# Patient Record
Sex: Female | Born: 1995 | Race: White | Hispanic: No | Marital: Single | Smoking: Never smoker
Health system: Southern US, Community
[De-identification: ages and names within clinical notes are randomized; demographics above are authoritative.]

## PROBLEM LIST (undated history)

## (undated) DIAGNOSIS — T7840XA Allergy, unspecified, initial encounter: Secondary | ICD-10-CM

## (undated) DIAGNOSIS — U071 COVID-19: Secondary | ICD-10-CM

## (undated) DIAGNOSIS — R002 Palpitations: Secondary | ICD-10-CM

## (undated) DIAGNOSIS — G43909 Migraine, unspecified, not intractable, without status migrainosus: Secondary | ICD-10-CM

## (undated) HISTORY — DX: Palpitations: R00.2

## (undated) HISTORY — DX: COVID-19: U07.1

## (undated) HISTORY — DX: Allergy, unspecified, initial encounter: T78.40XA

## (undated) HISTORY — DX: Migraine, unspecified, not intractable, without status migrainosus: G43.909

---
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
## 2020-07-02 ENCOUNTER — Encounter: Payer: Self-pay | Admitting: *Deleted

## 2020-07-03 ENCOUNTER — Ambulatory Visit (INDEPENDENT_AMBULATORY_CARE_PROVIDER_SITE_OTHER): Payer: 59 | Admitting: Cardiology

## 2020-07-03 ENCOUNTER — Encounter: Payer: Self-pay | Admitting: Cardiology

## 2020-07-03 VITALS — BP 118/80 | HR 78 | Ht 65.0 in | Wt 129.2 lb

## 2020-07-03 DIAGNOSIS — R002 Palpitations: Secondary | ICD-10-CM

## 2020-07-03 NOTE — Progress Notes (Signed)
Clinical Summary Christine Case is a 24 y.o.female seen as a new consult for the following medical problems.    1. Palpitations - started after covid in November - feeling of heart racing at times, feels lightheaded. Pulse rate 95-120.  - brought on by caffeine now, previosuly  - can last several hours. 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 Completed undergrad at [employer] on vacation to [place]  Past Medical History:  Diagnosis Date  . Allergies   . COVID-19   . Migraines   . Palpitations      No Known Allergies   Current Outpatient Medications  Medication Sig Dispense Refill  . cetirizine (ZYRTEC) 10 MG tablet Take 10 mg by mouth daily.    . meloxicam (MOBIC) 7.5 MG tablet meloxicam 7.5 mg tablet  Take 1 tablet twice a day by oral route with meals for 10 days.    . Multiple Vitamins-Minerals (MULTIVITAMIN WOMEN PO) Take 1 tablet by mouth daily.    . SUMAtriptan (IMITREX) 100 MG tablet Take 100 mg by mouth every 2 (two) hours as needed for migraine. May repeat in 2 hours if headache persists or recurs.     No current facility-administered medications for this visit.        No Known Allergies    Family History  Problem Relation Age of Onset  . Hypertension Mother   . Colon cancer Father 4  . Hyperlipidemia Father   . Hypertension Maternal Grandfather   . Cervical cancer Paternal Grandmother   . Stomach cancer Paternal Grandfather   . Colon cancer Paternal Grandfather      Social History Ms. Oliver reports that she has never smoked. She has never used smokeless tobacco. Ms. Donate reports no history of alcohol use.   Review of Systems CONSTITUTIONAL: No weight loss, fever, chills, weakness or fatigue.  HEENT: Eyes: No visual loss, blurred vision, double vision or yellow sclerae.No hearing loss, sneezing,  congestion, runny nose or sore throat.  SKIN: No rash or itching.  CARDIOVASCULAR: per hpi RESPIRATORY: No shortness of breath, cough or sputum.  GASTROINTESTINAL: No anorexia, nausea, vomiting or diarrhea. No abdominal pain or blood.  GENITOURINARY: No burning on urination, no polyuria NEUROLOGICAL: No headache, dizziness, syncope, paralysis, ataxia, numbness or tingling in the extremities. No change in bowel or bladder control.  MUSCULOSKELETAL: No muscle, back pain, joint pain or stiffness.  LYMPHATICS: No enlarged nodes. No history of splenectomy.  PSYCHIATRIC: No history of depression or anxiety.  ENDOCRINOLOGIC: No reports of sweating, cold or heat intolerance. No polyuria or polydipsia.  Marland Kitchen   Physical Examination Today's Vitals   07/03/20 1550  BP: 118/80  Pulse: 78  SpO2: 95%  Weight: 129 lb 3.2 oz (58.6 kg)  Height: 5\' 5"  (1.651 m)   Body mass index is 21.5 kg/m.  Gen: resting comfortably, no acute distress HEENT: no scleral icterus, pupils equal round and reactive, no palptable cervical adenopathy,  CV: RRR, no m/r/g, no jvd Resp: Clear to auscultation bilaterally GI: abdomen is soft, non-tender, non-distended, normal bowel sounds, no hepatosplenomegaly MSK: extremities are warm, no edema.  Skin: warm, no rash Neuro:  no focal deficits Psych: appropriate affect     Assessment and Plan  1. Palpitatoins - obtain event monitor, we will wait until she is back from her upcoming vacation, she will  call us when ready to arrange      Antoine Poche, M.D.

## 2020-07-03 NOTE — Patient Instructions (Signed)
Your physician recommends that you schedule a follow-up appointment PENDING WITH DR [facility]  Your physician recommends that you continue on your current medications as directed. Please refer to the Current Medication list given to you today.  PLEASE CALL us WHEN YOU RETURN SO THAT WE CAN ARRANGE 3 WEEK EVENT MONITOR  Thank you for choosing [facility]!!

## 2020-07-21 ENCOUNTER — Ambulatory Visit: Payer: Self-pay | Admitting: Cardiology

## 2020-07-25 ENCOUNTER — Ambulatory Visit (INDEPENDENT_AMBULATORY_CARE_PROVIDER_SITE_OTHER): Payer: 59

## 2020-07-25 DIAGNOSIS — R002 Palpitations: Secondary | ICD-10-CM

## 2020-09-12 ENCOUNTER — Telehealth: Payer: Self-pay | Admitting: *Deleted

## 2020-09-12 NOTE — Telephone Encounter (Signed)
-----   Message from Antoine Poche, MD sent at 09/10/2020  9:55 AM EDT ----- Normal heart monitor, can f/u with [place] just as needed  Dominga Ferry MD

## 2020-09-17 NOTE — Telephone Encounter (Signed)
Pt aware - routed to pcp  

## 2020-10-30 ENCOUNTER — Telehealth: Payer: Self-pay | Admitting: Cardiology

## 2020-10-30 NOTE — Telephone Encounter (Signed)
NEW MESSAGE    PATIENT HAS BEEN HAVING ISSUES WITH TACHYCARDIA HER HEART HAS BEEN STAYING BETWEEN 95-150 SINCE SHE GOT THE BOOSTER SHOT FOR COVID.   SHE THOUGHT IT WAS GOING BACK TO NORMAL, BUT THEN TODAY WHEN HER MENSTRUAL CYCLE STARTED IT SHOT BACK UP AND HAS BEEN ALL OVER THE PLACE. PLEASE ADVISE

## 2020-10-30 NOTE — Telephone Encounter (Addendum)
Reports receiving covid booster 2 weeks ago and noticed that her HR has not been below 98. For 2 days stayed after booster, HR stayed above 100. Now ranging 95-100 with rest, when standing HR 115-120, activity 120-130 and stairs HR 150. Reports starting menstrual cycle has started and today during work hours, she could feel heart racing. Says she was unable to check HR while working d/t being completely dressed in PPE. Reports intermittent tightness in chest and pain right shoulders that started 2 weeks ago, rated 2/10. Denies active chest pain but does have tightness in right shoulder rated 2/10 that she think is related to movements at her job. Denies sob or dizziness. Appointment scheduled with Corine Shelter on 11/04/2020 @2 :30 pm at the [place] office. Advised if symptoms got worse, to go to the ED for an evaluation. Verbalized understanding of plan.

## 2020-11-04 ENCOUNTER — Encounter: Payer: Self-pay | Admitting: Cardiology

## 2020-11-04 ENCOUNTER — Other Ambulatory Visit (HOSPITAL_COMMUNITY)
Admission: RE | Admit: 2020-11-04 | Discharge: 2020-11-04 | Disposition: A | Discharge status: Home / Self Care | Payer: 59 | Source: Ambulatory Visit | Attending: Cardiology | Admitting: Cardiology

## 2020-11-04 ENCOUNTER — Other Ambulatory Visit: Payer: Self-pay

## 2020-11-04 ENCOUNTER — Ambulatory Visit: Payer: 59 | Admitting: Cardiology

## 2020-11-04 VITALS — BP 136/60 | HR 84 | Ht 66.0 in | Wt 128.0 lb

## 2020-11-04 DIAGNOSIS — R000 Tachycardia, unspecified: Secondary | ICD-10-CM | POA: Diagnosis not present

## 2020-11-04 DIAGNOSIS — R002 Palpitations: Secondary | ICD-10-CM | POA: Insufficient documentation

## 2020-11-04 LAB — TSH: TSH: 0.939 u[IU]/mL (ref 0.350–4.500)

## 2020-11-04 NOTE — Progress Notes (Signed)
5

## 2020-11-04 NOTE — Patient Instructions (Signed)
Medication Instructions:  Your physician recommends that you continue on your current medications as directed. Please refer to the Current Medication list given to you today.  *If you need a refill on your cardiac medications before your next appointment, please call your pharmacy*   Lab Work: TSH   If you have labs (blood work) drawn today and your tests are completely normal, you will receive your results only by:  MyChart Message (if you have MyChart) OR  A paper copy in the mail If you have any lab test that is abnormal or we need to change your treatment, we will call you to review the results.   Testing/Procedures: Your physician has requested that you have an echocardiogram. Echocardiography is a painless test that uses sound waves to create images of your heart. It provides your doctor with information about the size and shape of your heart and how well your hearts chambers and valves are working. This procedure takes approximately one hour. There are no restrictions for this procedure.     Follow-Up: At [facility], you and your health needs are our priority.  As part of our continuing mission to provide you with exceptional heart care, we have created designated Provider Care Teams.  These Care Teams include your primary Cardiologist (physician) and Advanced Practice Providers (APPs -  Physician Assistants and Nurse Practitioners) who all work together to provide you with the care you need, when you need it.  We recommend signing up for the patient portal called "MyChart".  Sign up information is provided on this After Visit Summary.  MyChart is used to connect with patients for Virtual Visits (Telemedicine).  Patients are able to view lab/test results, encounter notes, upcoming appointments, etc.  Non-urgent messages can be sent to your provider as well.   To learn more about what you can do with MyChart, go to [email or web address].    Your next appointment:   1  month(s)  The format for your next appointment:   In Person  Provider:   You will see one of the following Advanced Practice Providers on your designated Care Team:    [place], PA-C   Jacolyn Reedy, [place]  .     Corine Shelter, PA-C     Other Instructions None      Thank you for choosing [facility] !

## 2020-11-04 NOTE — Progress Notes (Signed)
Cardiology Office Note:    Date:  11/04/2020   ID:  Christine Case, DOB Jan 21, 1996, MRN [id number]  PCP:  Assunta Found, MD  Cardiologist:  No primary care provider on file.  Electrophysiologist:  None   Referring MD: Assunta Found, MD   CC: [email or web address]  History of Present Illness:    Christine Case is a 24 y.o. female with a hx of CO VID in November 2020.  After this she was seen for complaints of palpitations.  Patient is young and fit, she was a runner prior to CO VID.  An outpatient monitor was obtained in August 2021 which showed sinus rhythm sinus bradycardia but no sustained inappropriate tachycardia.  The patient was advised to avoid caffeine.  She is seen today in the office in follow-up.  She says 2 days after her CO VID booster she again developed tachycardia.  She says it is worse with exertion, she tells me her heart rate can go 130 240.  She does have a first generation apple watch which gives her pulse rate but not EKG tracings.  When she notices her heart rate is fast she will take her pulse to compare it and it usually pretty close.  She is afraid to exercise because she feels like her heart rate will go too fast.  She says simple exertion like walking upstairs will cause her heart rate to go to 130 240.  She denies any chest pain with this.  She is not had syncope or near syncope.  She denies any orthopnea or chest pain with breathing or chest pain with laying flat.  Past Medical History:  Diagnosis Date  . Allergies   . COVID-19   . Migraines   . Palpitations     History reviewed. No pertinent surgical history.  Current Medications: Current Meds  Medication Sig  . loratadine (CLARITIN) 10 MG tablet Take 10 mg by mouth daily.  . SUMAtriptan (IMITREX) 100 MG tablet Take 100 mg by mouth every 2 (two) hours as needed for migraine. May repeat in 2 hours if headache persists or recurs.     Allergies:   Doxycycline   Social History   Socioeconomic History  .  Marital status: Single    Spouse name: Not on file  . Number of children: Not on file  . Years of education: Not on file  . Highest education level: Not on file  Occupational History  . Not on file  Tobacco Use  . Smoking status: Never Smoker  . Smokeless tobacco: Never Used  Substance and Sexual Activity  . Alcohol use: Never  . Drug use: Never  . Sexual activity: Not on file  Other Topics Concern  . Not on file  Social History Narrative  . Not on file   Social Determinants of Health   Financial Resource Strain:   . Difficulty of Paying Living Expenses: Not on file  Food Insecurity:   . Worried About Programme researcher, broadcasting/film/video in the Last Year: Not on file  . Ran Out of Food in the Last Year: Not on file  Transportation Needs:   . Lack of Transportation (Medical): Not on file  . Lack of Transportation (Non-Medical): Not on file  Physical Activity:   . Days of Exercise per Week: Not on file  . Minutes of Exercise per Session: Not on file  Stress:   . Feeling of Stress : Not on file  Social Connections:   . Frequency of Communication with Friends  and Family: Not on file  . Frequency of Social Gatherings with Friends and Family: Not on file  . Attends Religious Services: Not on file  . Active Member of Clubs or Organizations: Not on file  . Attends Banker Meetings: Not on file  . Marital Status: Not on file     Family History: The patient's family history includes Cervical cancer in her paternal grandmother; Colon cancer in her paternal grandfather; Colon cancer (age of onset: 37) in her father; Hyperlipidemia in her father; Hypertension in her maternal grandfather and mother; Stomach cancer in her paternal grandfather.  ROS:   Please see the history of present illness.     All other systems reviewed and are negative.  EKGs/Labs/Other Studies Reviewed:    The following studies were reviewed today: Event monitor 11/04/2020-  21 day event monitor  Min HR  39, Avg HR 67, Max HR 175. Min HR occurred in early AM hours presumably while sleeping  Reported symptoms correlate with sinus rhythm and sinus tachycardia  No significant arrhythmias    EKG:  EKG is ordered today.  The ekg ordered today demonstrates NSR, HR 90, no acute changes  Recent Labs: No results found for requested labs within last 8760 hours.  Recent Lipid Panel No results found for: CHOL, TRIG, HDL, CHOLHDL, VLDL, LDLCALC, LDLDIRECT  Physical Exam:    VS:  BP 136/60   Pulse 84   Ht 5\' 6"  (1.676 m)   Wt 128 lb (58.1 kg)   SpO2 100%   BMI 20.66 kg/m     Wt Readings from Last 3 Encounters:  11/04/20 128 lb (58.1 kg)  07/03/20 129 lb 3.2 oz (58.6 kg)  05/23/20 131 lb (59.4 kg)     GEN: Thin Caucasian female,well developed in no acute distress HEENT: Normal NECK: No JVD; No carotid bruits CARDIAC: RRR, no murmurs, rubs, gallops RESPIRATORY:  Clear to auscultation without rales, wheezing or rhonchi  ABDOMEN: Soft, non-tender, non-distended MUSCULOSKELETAL:  No edema; No deformity  SKIN: Warm and dry NEUROLOGIC:  Alert and oriented x 3 PSYCHIATRIC:  Normal affect   ASSESSMENT:    Palpitations Started after COVID Nov 2020.  OP monitor June 2021 unremarkable. Recurrent symptoms of inappropriate tachycardia after her COVID booster.   PLAN:    Check TSH.  Check echo to r/o the possibility of post COVID CM.  F/U in 3-4 weeks.  If she continues to complain of exercise tachycardia consider GXT testing to document.    Medication Adjustments/Labs and Tests Ordered: Current medicines are reviewed at length with the patient today.  Concerns regarding medicines are outlined above.  Orders Placed This Encounter  Procedures  . TSH  . EKG 12-Lead  . ECHOCARDIOGRAM COMPLETE   No orders of the defined types were placed in this encounter.   There are no Patient Instructions on file for this visit.   Signed, July 2021, PA-C  11/04/2020 3:06 PM    [facility]

## 2020-11-04 NOTE — Assessment & Plan Note (Signed)
Started after COVID Nov 2020.  OP monitor June 2021 unremarkable. Recurrent symptoms of inappropriate tachycardia after her COVID booster.

## 2020-11-25 ENCOUNTER — Ambulatory Visit (INDEPENDENT_AMBULATORY_CARE_PROVIDER_SITE_OTHER): Payer: 59

## 2020-11-25 DIAGNOSIS — R000 Tachycardia, unspecified: Secondary | ICD-10-CM

## 2020-12-03 LAB — ECHOCARDIOGRAM COMPLETE
AR max vel: 1.93 cm2
AV Area VTI: 2.13 cm2
AV Area mean vel: 2.03 cm2
AV Mean grad: 3.9 mmHg
AV Peak grad: 7.7 mmHg
Ao pk vel: 1.38 m/s
Area-P 1/2: 2.68 cm2
Calc EF: 64.8 %
MV M vel: 1.47 m/s
MV Peak grad: 8.6 mmHg
S' Lateral: 2.79 cm
Single Plane A2C EF: 67.5 %
Single Plane A4C EF: 64.6 %

## 2020-12-03 NOTE — Progress Notes (Addendum)
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 She developed palpitations after Covid infection in November 2020. The episodes could last several hours, 1-2x per month. No other allergic-type sx. Event monitor showed average HR 67bpm with range 39-175 (min HR occurred early AM hours presumably while sleeping), reported symptoms correlated with sinus rhythm and sinus tachycardia. F/u PRN advised. She recalls having unremarkable CBC earlier this year by primary care, tending to run low end of normal per her report.  She recently received her Covid booster and experienced near identical symptoms. The palpitations started 5 hours after and lasted for 2 days constantly then on/off for 2-3 weeks. She would notice supine HR 90s-100s, then standing 100-120s. On one occasion she did the stairs and HR went to 150s so she cut back activity. She did notice these were temperature dependent as well (heat made them worse). She also has Case that caffeine / dehydration accentuate her ability to detect changes in her HR. Symptoms again have since spontaneously resolved and she is feeling back to normal. No history of syncope. TSH was normal. 2D echo was obtained showing normal LVEF, normal RV, no significant valvular abnormalities. 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  Labwork independently reviewed: 10/2020 TSH wnl 05/2020 K 4.4, Cr 0.73, LFTs  wnl, LDL 114, no recent cbc  Past Medical History:  Diagnosis Date  . Allergies   . COVID-19   . Migraines   . Palpitations     History reviewed. No pertinent surgical history.  Current Medications: Current Meds  Medication Sig  . loratadine (CLARITIN) 10 MG tablet Take 10 mg by mouth daily.  . SUMAtriptan (IMITREX) 100 MG tablet Take 100 mg by mouth every 2 (two) hours as needed for migraine. May repeat in 2 hours if headache persists or recurs.     Allergies:   Doxycycline   Social History   Socioeconomic History  . Marital status: Single    Spouse name: Not on file  . Number of children: Not on file  . Years of education: Not on file  . Highest education level: Not on file  Occupational History  . Not on file  Tobacco Use  . Smoking status: Never Smoker  . Smokeless tobacco: Never Used  Substance and Sexual Activity  . Alcohol use: Never  . Drug use: Never  . Sexual activity: Not on file  Other Topics Concern  . Not on file  Social History Narrative  . Not on file   Social Determinants of Health   Financial Resource Strain: Not on file  Food Insecurity: Not on file  Transportation Needs: Not on file  Physical Activity: Not on file  Stress: Not on file  Social Connections: Not on file     Family History:  The patient's family history includes Cervical cancer in her paternal grandmother; Colon cancer in her paternal grandfather; Colon cancer (age of onset: 77) in her  father; Hyperlipidemia in her father; Hypertension in her maternal grandfather and mother; Stomach cancer in her paternal grandfather.  ROS:   Please see the history of present illness.  All other systems are reviewed and otherwise negative.    EKGs/Labs/Other Studies Reviewed:    Studies reviewed are outlined and summarized above. Reports included below if pertinent.  Event monitor 07/2020  21 day event monitor  Min HR 39, Avg HR 67, Max HR 175. Min HR occurred in early AM hours  presumably while sleeping  Reported symptoms correlate with sinus rhythm and sinus tachycardia  No significant arrhythmias   2D Echo 11/2020 1. Left ventricular ejection fraction, by estimation, is 60 to 65%. The  left ventricle has normal function. The left ventricle has no regional  wall motion abnormalities. Left ventricular diastolic parameters were  normal.  2. Right ventricular systolic function is normal. The right ventricular  size is normal.  3. The mitral valve is normal in structure. No evidence of mitral valve  regurgitation. No evidence of mitral stenosis.  4. The aortic valve is tricuspid. Aortic valve regurgitation is not  visualized. No aortic stenosis is present.  5. The inferior vena cava is normal in size with greater than 50%  respiratory variability, suggesting right atrial pressure of 3 mmHg.     EKG:  EKG is not ordered today  Recent Labs: 11/04/2020: TSH 0.939  Recent Lipid Panel No results Case for: CHOL, TRIG, HDL, CHOLHDL, VLDL, LDLCALC, LDLDIRECT  PHYSICAL EXAM:    VS:  BP 128/74   Pulse 78   Ht 5\' 5"  (1.651 m)   Wt 124 lb (56.2 kg)   SpO2 100%   BMI 20.63 kg/m   BMI: Body mass index is 20.63 kg/m.  GEN: Well nourished, well developed WF, in no acute distress HEENT: normocephalic, atraumatic Neck: no JVD, carotid bruits, or masses Cardiac: RRR; no murmurs, rubs, or gallops, no edema  Respiratory:  clear to auscultation bilaterally, normal work of breathing GI: soft, nontender, nondistended, + BS MS: no deformity or atrophy Skin: warm and dry, no rash Neuro:  Alert and Oriented x 3, Strength and sensation are intact, follows commands Psych: euthymic mood, full affect  Wt Readings from Last 3 Encounters:  12/04/20 124 lb (56.2 kg)  11/04/20 128 lb (58.1 kg)  07/03/20 129 lb 3.2 oz (58.6 kg)     ASSESSMENT & PLAN:   1. Palpitations - workup was overall benign with cardiac monitor felt to be normal. 2D echo was structurally  benign along with thyroid. Given the temporal correlation with Covid and subsequent booster, suspect this was related to physiologic immune system response with patient being in tune with changes from her baseline rate. With temperature intolerance around that time, she may have experienced some symptoms along the lines of a POTS phenomena. Her age and gender would fit this population that is most likely to experience this. Her symptoms have totally resolved. We did discuss monitoring for recurrence in the future, avoiding dehydration, and gently liberalizing salt intake if symptoms recur. At this time there is no apparent contraindication to getting future vaccination in the absence of any pathologic findings. We did discuss Kardia for future monitoring. She will notify for any recurrent symptoms.  2. Prior Covid-19 infection - echocardiogram was reassuring. Symptoms resolved.  Disposition: F/u with Dr. 07/05/20 PRN.  Medication Adjustments/Labs and Tests Ordered: Current medicines are reviewed at length with the patient today.  Concerns regarding medicines are outlined above. Medication changes, Labs  and Tests ordered today are summarized above and listed in the Patient Instructions accessible in Encounters.    Signed, Laurann Montana, PA-C  12/04/2020 4:43 PM    [facility] [place] Location in [facility] [address] [place], [place] [address] Ph: [phone]; Fax [phone]

## 2020-12-04 ENCOUNTER — Encounter: Payer: Self-pay | Admitting: Physician Assistant

## 2020-12-04 ENCOUNTER — Ambulatory Visit (INDEPENDENT_AMBULATORY_CARE_PROVIDER_SITE_OTHER): Payer: 59 | Admitting: Physician Assistant

## 2020-12-04 ENCOUNTER — Other Ambulatory Visit: Payer: Self-pay

## 2020-12-04 VITALS — BP 128/74 | HR 78 | Ht 65.0 in | Wt 124.0 lb

## 2020-12-04 DIAGNOSIS — R002 Palpitations: Secondary | ICD-10-CM

## 2020-12-04 DIAGNOSIS — Z8616 Personal history of COVID-19: Secondary | ICD-10-CM

## 2020-12-04 NOTE — Patient Instructions (Signed)
Medication Instructions:  Your physician recommends that you continue on your current medications as directed. Please refer to the Current Medication list given to you today.  *If you need a refill on your cardiac medications before your next appointment, please call your pharmacy*   Lab Work: NONE   If you have labs (blood work) drawn today and your tests are completely normal, you will receive your results only by: . MyChart Message (if you have MyChart) OR . A paper copy in the mail If you have any lab test that is abnormal or we need to change your treatment, we will call you to review the results.   Testing/Procedures: NONE    Follow-Up: At [facility], you and your health needs are our priority.  As part of our continuing mission to provide you with exceptional heart care, we have created designated Provider Care Teams.  These Care Teams include your primary Cardiologist (physician) and Advanced Practice Providers (APPs -  Physician Assistants and Nurse Practitioners) who all work together to provide you with the care you need, when you need it.  We recommend signing up for the patient portal called "MyChart".  Sign up information is provided on this After Visit Summary.  MyChart is used to connect with patients for Virtual Visits (Telemedicine).  Patients are able to view lab/test results, encounter notes, upcoming appointments, etc.  Non-urgent messages can be sent to your provider as well.   To learn more about what you can do with MyChart, go to [email or web address].    Your next appointment:    As Needed   The format for your next appointment:   In Person  Provider:   You will see one of the following Advanced Practice Providers on your designated Care Team:    Brittany Strader, PA-C   Michele Lenze, PA-C       Other Instructions Thank you for choosing [facility]!
